# Patient Record
Sex: Female | Born: 2005 | Race: White | Hispanic: Yes | Marital: Single | Smoking: Never smoker
Health system: Southern US, Community
[De-identification: ages and names within clinical notes are randomized; demographics above are authoritative.]

## PROBLEM LIST (undated history)

## (undated) DIAGNOSIS — R011 Cardiac murmur, unspecified: Secondary | ICD-10-CM

---
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
## 2023-03-24 ENCOUNTER — Ambulatory Visit: Payer: Self-pay | Admitting: Family Medicine

## 2023-03-24 NOTE — Telephone Encounter (Signed)
  Chief Complaint: requesting pregnancy test Symptoms: occasional nausea, breast tenderness Frequency: intermittent with menstrual cycle. Pertinent Negatives: Patient denies vomiting, diarrhea, abdominal pain, urinary symptoms Disposition: [] ED /[] Urgent Care (no appt availability in office) / [] Appointment(In office/virtual)/ []  [facility]/ [x] Home Care/ [] Refused Recommended Disposition /[] [facility]/ []  Follow-up with PCP Additional Notes: Pt called stating she had unprotected sex one week ago and wants a pregnancy test. Pt states that her LMP was 03/01/23 and her cycles are regular. She states her current symptoms of intermittent nausea and breast tenderness are normal prior to getting her period. Per protocol, home care recommended. Pt asked if appt was available 03/26/23 in the morning, advised pt that next available was 03/30/23. Pt states she would like to wait to make an appt at this time and will call back if one is needed. Pt verbalized understanding of care recommendations. Alerting PCP for review.   Copied from CRM [id number]. Topic: Clinical - Red Word Triage >> Mar 24, 2023  3:15 PM Dondra Prader A wrote: Red Word that prompted transfer to Nurse Triage: Pt is calling because she thinks she is pregnant. Reason for Disposition  Pregnancy suspected or possible  Answer Assessment - Initial Assessment Questions 1. LMP:  "When did your last menstrual period begin?"     March 01, 2023 2. DAYS LATE: "How many days late is your menstrual period?"     Not late at this time 3. REGULARITY: "How regular are your periods?"     Regular 4. PREGNANCY: "Is there any chance you are pregnant?" (e.g., unprotected intercourse, missed birth control pill, broken condom) "Have you used a home pregnancy test?"     Unprotected sex 5. BREASTFEEDING: "Are you breastfeeding?"     denies 6. BIRTH CONTROL PILLS: "Are you taking birth control pills, or have you stopped recently?"      denies 7. LONG-ACTING CONTRACEPTION: "Has your doctor given you a shot to prevent pregnancy?" (e.g., Depo-Provera injection) "Do you have an intrauterine device (IUD)".     denies 8. CAUSE: "What do you think caused the missed period?" (e.g., stress, rapid weight loss, excessive exercise)     Possible pregnancy 9. OTHER SYMPTOMS: "Do you have any other symptoms?" (e.g., abdomen pain)     Nausea occasionally  Protocols used: Menstrual Period - Missed or Late-A-AH

## 2024-06-14 ENCOUNTER — Encounter: Admitting: Family Medicine
